# Patient Record
Sex: Female | Born: 1935 | Race: White | Hispanic: No | Marital: Single | Smoking: Former smoker
Health system: Southern US, Community
[De-identification: ages and names within clinical notes are randomized; demographics above are authoritative.]

## PROBLEM LIST (undated history)

## (undated) DIAGNOSIS — H269 Unspecified cataract: Secondary | ICD-10-CM

## (undated) DIAGNOSIS — E119 Type 2 diabetes mellitus without complications: Secondary | ICD-10-CM

## (undated) DIAGNOSIS — I10 Essential (primary) hypertension: Secondary | ICD-10-CM

## (undated) DIAGNOSIS — I219 Acute myocardial infarction, unspecified: Secondary | ICD-10-CM

## (undated) DIAGNOSIS — E785 Hyperlipidemia, unspecified: Secondary | ICD-10-CM

## (undated) HISTORY — PX: EYE SURGERY: SHX253

## (undated) HISTORY — DX: Essential (primary) hypertension: I10

## (undated) HISTORY — DX: Hyperlipidemia, unspecified: E78.5

## (undated) HISTORY — DX: Type 2 diabetes mellitus without complications: E11.9

## (undated) HISTORY — DX: Unspecified cataract: H26.9

## (undated) HISTORY — DX: Acute myocardial infarction, unspecified: I21.9

---

## 1989-12-03 DIAGNOSIS — I219 Acute myocardial infarction, unspecified: Secondary | ICD-10-CM

## 1989-12-03 HISTORY — DX: Acute myocardial infarction, unspecified: I21.9

## 2016-04-06 ENCOUNTER — Encounter: Payer: Self-pay | Admitting: Family Medicine

## 2016-04-06 ENCOUNTER — Ambulatory Visit (INDEPENDENT_AMBULATORY_CARE_PROVIDER_SITE_OTHER): Payer: Medicare HMO | Admitting: Family Medicine

## 2016-04-06 ENCOUNTER — Ambulatory Visit (INDEPENDENT_AMBULATORY_CARE_PROVIDER_SITE_OTHER): Payer: Medicare HMO

## 2016-04-06 VITALS — BP 146/52 | HR 80 | Temp 97.0°F | Ht 60.0 in | Wt 123.8 lb

## 2016-04-06 DIAGNOSIS — E119 Type 2 diabetes mellitus without complications: Secondary | ICD-10-CM | POA: Diagnosis not present

## 2016-04-06 DIAGNOSIS — F411 Generalized anxiety disorder: Secondary | ICD-10-CM | POA: Insufficient documentation

## 2016-04-06 DIAGNOSIS — R0781 Pleurodynia: Secondary | ICD-10-CM

## 2016-04-06 DIAGNOSIS — I10 Essential (primary) hypertension: Secondary | ICD-10-CM

## 2016-04-06 NOTE — Patient Instructions (Signed)
PLease continue using the pain cream for your back  Cutting back to twice a day on your ativan will help you fall less.   Come back in 3-4 weeks for follow up

## 2016-04-06 NOTE — Progress Notes (Signed)
   HPI  Patient presents today here to establish care for an acute visit  Patient explains that about one week ago she fell landing on her right posterior rib cage. She continues to have focal pain in that area that is improved with pain cream that her daughter's been applying.  She states that she has had several falls lately. She takes lorazepam 3 times a day.  She has good medication compliance with her other medications.  She has no other complaints today  PMH: Smoking status noted Her past medical, surgical, social, family history reviewed and updated in EMR ROS: Per HPI  Objective: BP 146/52 mmHg  Pulse 80  Temp(Src) 97 F (36.1 C) (Oral)  Ht 5' (1.524 m)  Wt 123 lb 12.8 oz (56.155 kg)  BMI 24.18 kg/m2 Gen: NAD, alert, cooperative with exam HEENT: NCAT CV: RRR, good S1/S2, no murmur Resp: CTABL, no wheezes, non-labored Ext: No edema, warm Neuro: Alert and oriented, No gross deficits MSK: Mild tenderness to palpation of the right posterior ribs around the edge of the clavicle  Chest x-ray No acute rib fractures seen, no acute lung disease  Assessment and plan:  # Fall, rib pain No evidence of rib fractures, she is getting some good improvement with over-the-counter pain cream Considering age and that she already takes hydrocodone I did not give her any additional pain medications Recommended cutting back on Ativan to 2 times during the day rather than 3 times a day.  # Hypertension Reasonably controlled for her age Continue losartan   # Type 2 diabetes Tolerating medications well Plan to get A1c next visit and request records  # Anxiety Encouraged her to reduce her dose of Ativan as above Would recommend changing her over to an SSRI for long-term treatment    Orders Placed This Encounter  Procedures  . DG Ribs Unilateral W/Chest Right    Standing Status: Future     Number of Occurrences: 1     Standing Expiration Date: 06/06/2017    Order Specific  Question:  Reason for Exam (SYMPTOM  OR DIAGNOSIS REQUIRED)    Answer:  fall    Order Specific Question:  Preferred imaging location?    Answer:  Internal     Murtis SinkSam Judia Arnott, MD [facility] 04/06/2016, 7:10 PM

## 2016-05-03 DEATH — deceased

## 2016-06-11 ENCOUNTER — Telehealth: Payer: Self-pay | Admitting: Family Medicine

## 2017-05-30 IMAGING — CR DG RIBS W/ CHEST 3+V*R*
3 series · 3 of 3 positions shown · non-contrast
Comparison: None.

CLINICAL DATA: 89-year-old female with right chest and right rib
pain following fall 2 weeks ago. Initial encounter.

EXAM:
RIGHT RIBS AND CHEST - 3+ VIEW

[view not recorded (1 of 3)]
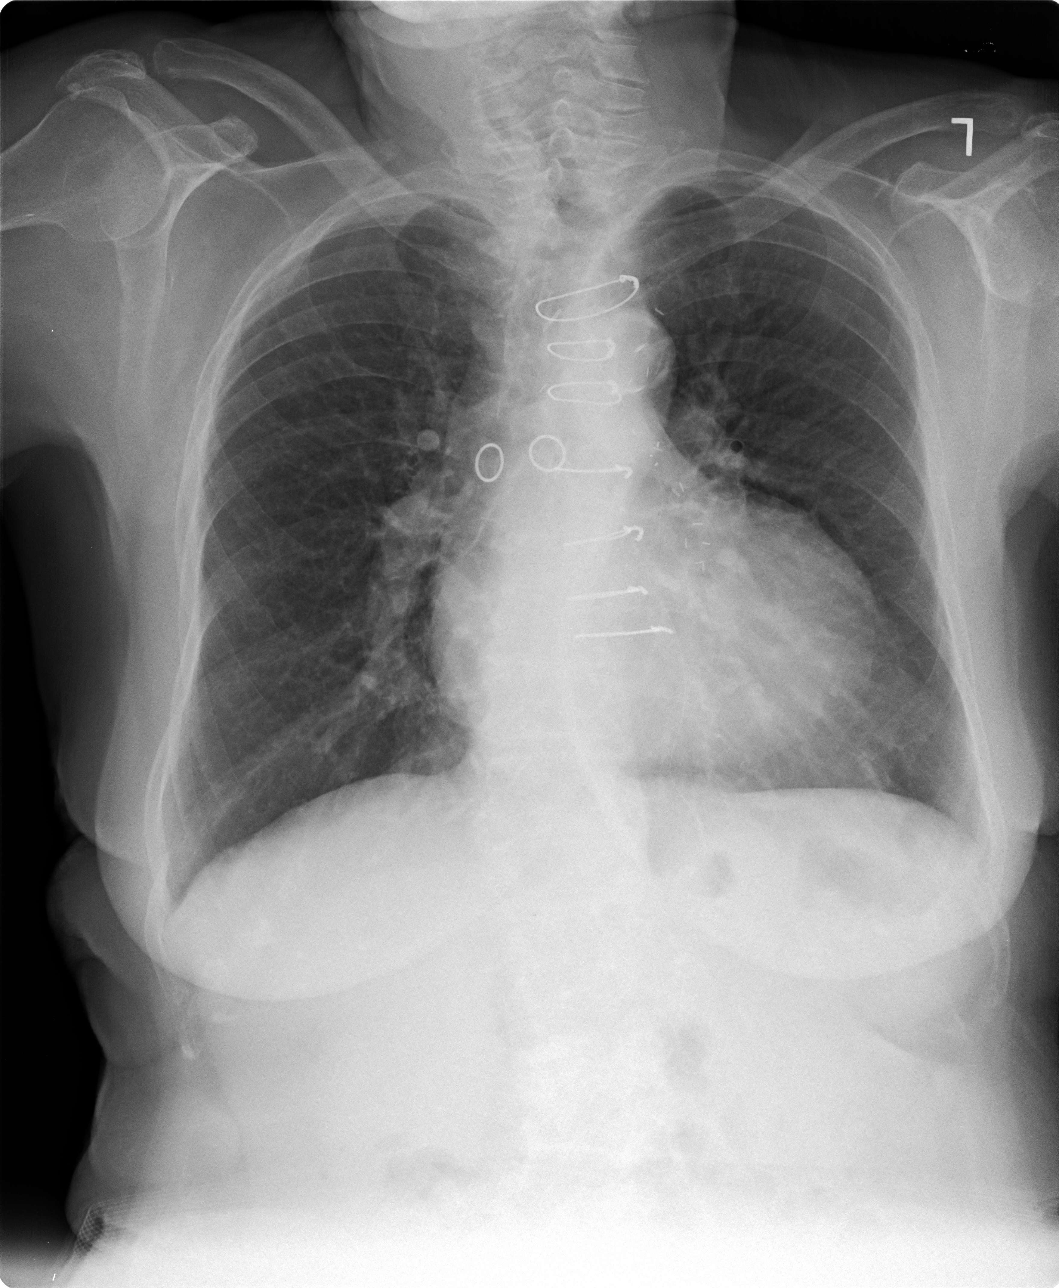

[view not recorded (2 of 3)]
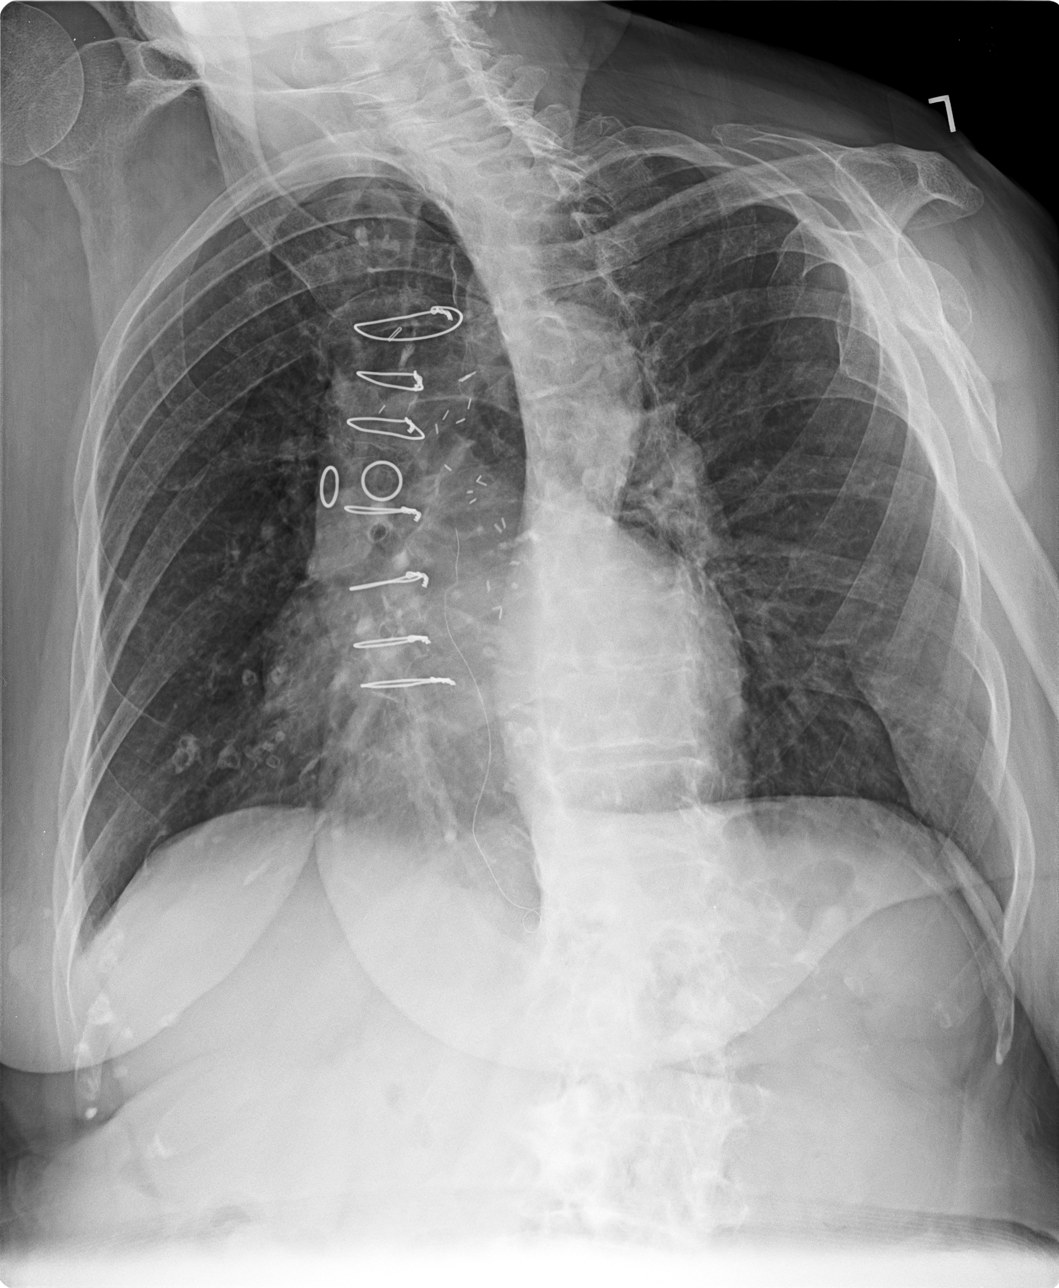

[view not recorded (3 of 3)]
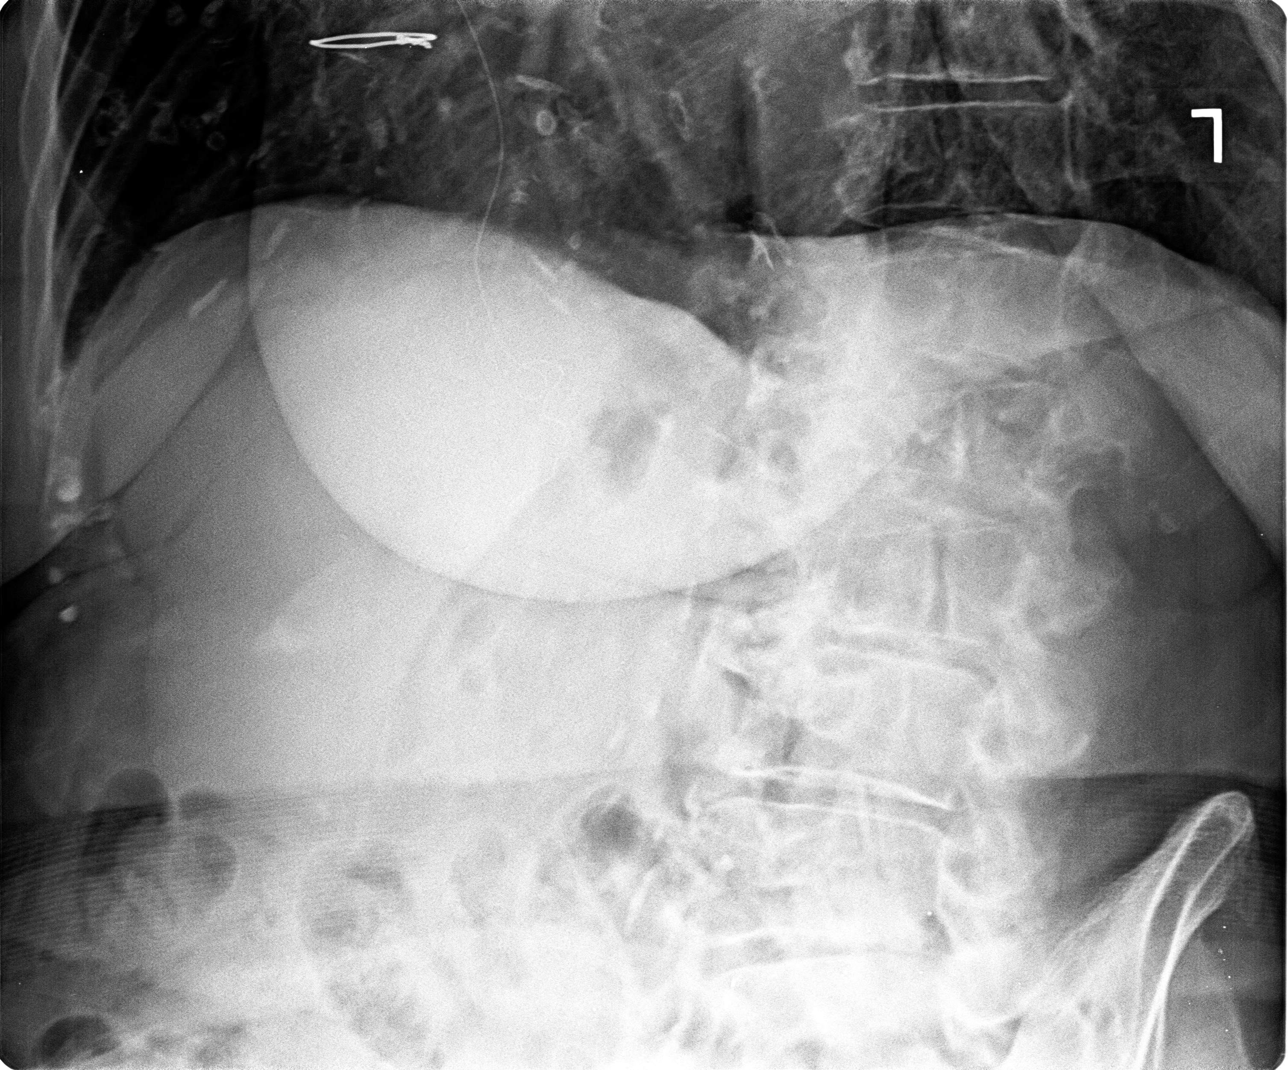

[3 of 3 positions shown; findings below may reference images not displayed]

FINDINGS: Cardiomegaly and CABG changes noted.

There is no evidence of focal airspace disease, pulmonary edema,
suspicious pulmonary nodule/mass, pleural effusion, or pneumothorax.
No acute bony abnormalities are identified.
IMPRESSION: Cardiomegaly without evidence of acute cardiopulmonary disease or
acute rib fracture.
